# Patient Record
Sex: Female | Born: 1980 | Race: White | Hispanic: No | Marital: Single
Health system: Southern US, Academic
[De-identification: ages and names within clinical notes are randomized; demographics above are authoritative.]

---
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
## 2018-09-12 DIAGNOSIS — I823 Embolism and thrombosis of renal vein: Secondary | ICD-10-CM

## 2020-05-02 ENCOUNTER — Other Ambulatory Visit (HOSPITAL_COMMUNITY): Payer: Self-pay

## 2020-05-02 LAB — EXTERNAL COVID-19 MOLECULAR RESULT: External 2019-n-CoV/SARS-CoV-2: POSITIVE — AB

## 2021-06-24 ENCOUNTER — Emergency Department (HOSPITAL_COMMUNITY): Payer: BC Managed Care – PPO

## 2021-06-24 ENCOUNTER — Encounter (HOSPITAL_COMMUNITY): Payer: Self-pay | Admitting: Physician Assistant

## 2021-06-24 ENCOUNTER — Emergency Department
Admission: EM | Admit: 2021-06-24 | Discharge: 2021-06-24 | Disposition: A | Discharge status: Home / Self Care | Payer: BC Managed Care – PPO | Attending: Physician Assistant | Admitting: Physician Assistant

## 2021-06-24 ENCOUNTER — Other Ambulatory Visit: Payer: Self-pay

## 2021-06-24 DIAGNOSIS — U071 COVID-19: Secondary | ICD-10-CM | POA: Insufficient documentation

## 2021-06-24 DIAGNOSIS — M791 Myalgia, unspecified site: Secondary | ICD-10-CM | POA: Insufficient documentation

## 2021-06-24 DIAGNOSIS — Z6841 Body Mass Index (BMI) 40.0 and over, adult: Secondary | ICD-10-CM

## 2021-06-24 DIAGNOSIS — J45909 Unspecified asthma, uncomplicated: Secondary | ICD-10-CM | POA: Insufficient documentation

## 2021-06-24 DIAGNOSIS — R509 Fever, unspecified: Secondary | ICD-10-CM | POA: Insufficient documentation

## 2021-06-24 DIAGNOSIS — R059 Cough, unspecified: Secondary | ICD-10-CM | POA: Insufficient documentation

## 2021-06-24 LAB — CBC WITH DIFF
BASOPHIL #: 0 10*3/uL (ref 0.00–0.30)
BASOPHIL %: 0 % (ref 0–3)
EOSINOPHIL #: 0.2 10*3/uL (ref 0.00–0.80)
EOSINOPHIL %: 3 % (ref 0–7)
HCT: 42.7 % (ref 37.0–47.0)
HGB: 14.7 g/dL (ref 12.5–16.0)
LYMPHOCYTE #: 1.1 10*3/uL (ref 1.10–5.00)
LYMPHOCYTE %: 19 % — ABNORMAL LOW (ref 25–45)
MCH: 31.4 pg (ref 27.0–32.0)
MCHC: 34.5 g/dL (ref 32.0–36.0)
MCV: 91.1 fL (ref 78.0–99.0)
MONOCYTE #: 0.7 10*3/uL (ref 0.00–1.30)
MONOCYTE %: 12 % (ref 0–12)
MPV: 8.5 fL (ref 7.4–10.4)
NEUTROPHIL #: 3.8 10*3/uL (ref 1.80–8.40)
NEUTROPHIL %: 66 % (ref 40–76)
PLATELETS: 243 10*3/uL (ref 140–440)
RBC: 4.69 10*6/uL (ref 4.20–5.40)
RDW: 13.2 % (ref 11.6–14.8)
WBC: 5.9 10*3/uL (ref 4.0–10.5)
WBCS UNCORRECTED: 5.9 10*3/uL

## 2021-06-24 LAB — COMPREHENSIVE METABOLIC PANEL, NON-FASTING
ALBUMIN/GLOBULIN RATIO: 1.2 (ref 0.8–1.4)
ALBUMIN: 3.8 g/dL (ref 3.5–5.7)
ALKALINE PHOSPHATASE: 63 U/L (ref 34–104)
ALT (SGPT): 13 U/L (ref 7–52)
ANION GAP: 7 mmol/L — ABNORMAL LOW (ref 10–20)
AST (SGOT): 13 U/L (ref 13–39)
BILIRUBIN TOTAL: 0.5 mg/dL (ref 0.3–1.2)
BUN/CREA RATIO: 16 (ref 6–22)
BUN: 13 mg/dL (ref 7–25)
CALCIUM, CORRECTED: 8.9 mg/dL (ref 8.9–10.8)
CALCIUM: 8.7 mg/dL (ref 8.6–10.3)
CHLORIDE: 109 mmol/L — ABNORMAL HIGH (ref 98–107)
CO2 TOTAL: 24 mmol/L (ref 21–31)
CREATININE: 0.82 mg/dL (ref 0.60–1.30)
ESTIMATED GFR: 92 mL/min/{1.73_m2} (ref >59)
GLOBULIN: 3.1 (ref 2.9–5.4)
GLUCOSE: 99 mg/dL (ref 74–109)
OSMOLALITY, CALCULATED: 280 mOsm/kg (ref 270–290)
POTASSIUM: 4.2 mmol/L (ref 3.5–5.1)
PROTEIN TOTAL: 6.9 g/dL (ref 6.4–8.9)
SODIUM: 140 mmol/L (ref 136–145)

## 2021-06-24 LAB — COVID-19, FLU A/B, RSV RAPID BY PCR
INFLUENZA VIRUS TYPE A: NOT DETECTED
INFLUENZA VIRUS TYPE B: NOT DETECTED
RESPIRATORY SYNCTIAL VIRUS (RSV): NOT DETECTED
SARS-CoV-2: DETECTED — AB

## 2021-06-24 NOTE — ED Nurses Note (Signed)
Patient discharged home with family.  AVS reviewed with patient.  A written copy of the AVS and discharge instructions was given to the patient.  Questions sufficiently answered as needed.  Patient encouraged to follow up with PCP as indicated.  In the event of an emergency, patient instructed to call 911 or go to the nearest emergency room.

## 2021-06-24 NOTE — ED Provider Notes (Signed)
[facility]  ED Primary Provider Note  History of Present Illness   Chief Complaint   Patient presents with   . Flu Like Symptoms     Bailey Garcia is a 41 y.o. female who had concerns including Flu Like Symptoms.  Arrival: The patient arrived by Car    Pt presents with myalgia, dry cough, fever Tmax 102.7 yesterday.Increased SOB from baseline. She has a history of asthma controlled with albuterol.  She has been using her inhaler every 4-6 hours which does offer relief.  Patient taking Tylenol and Motrin as needed for fever which does off slowly.  She denies earache, sore throat, abdominal pain, nausea, vomiting.        Review of Systems   Pertinent positive and negative ROS as per HPI.  Historical Data   History Reviewed This Encounter: Medical History  Surgical History  Family History  Social History      Physical Exam   ED Triage Vitals [06/24/21 1134]   BP (Non-Invasive) 126/86   Heart Rate 89   Respiratory Rate 18   Temperature 37 C (98.6 F)   SpO2 100 %   Weight 110 kg (242 lb)   Height 1.626 m ('5\' 4"' )     Physical Exam   General: WDWN, appears stated age, alert in NAD. Cooperative throughout the exam.  Eyes:  no orbital edema or erythema, pupils equal, EOMI, no scleral icterus or conjunctival injection,  Ears: External ear and EAC without erythema, edema or deformity. TM is clear with appropriate landmarks and without erythema or effusion.  Nose: Nares patent without discharge. Turbinants appear normal size and color.   Throat: Mucosa moist, soft and hard palate without erythema or lesions. Uvula midline. Tonsils and posterior pharyngeal wall are without edema, erythema or exudate.    Neck: Supple and without LAD  CV: RRR, crisp S1, S2, no extra heart sounds,   Resp: LCTAB, no respiratory distress  MSK: moves all extremities  Skin: warm and intact, of normal color, no lesions noted  Neuro: normal tone, no involuntary movements, GCS 4,5,6.   Psych: speech and affect are  appropriate, thought processes intact   Patient Data     Labs Ordered/Reviewed   COMPREHENSIVE METABOLIC PANEL, NON-FASTING - Abnormal; Notable for the following components:       Result Value    CHLORIDE 109 (*)     ANION GAP 7 (*)     All other components within normal limits    Narrative:     Estimated Glomerular Filtration Rate (eGFR) is calculated using the CKD-EPI (2021) equation, intended for patients 18 years of age and older. If gender is not documented or "unknown", there will be no eGFR calculation.   COVID-19, FLU A/B, RSV RAPID BY PCR - Abnormal; Notable for the following components:    SARS-CoV-2 Detected (*)     All other components within normal limits    Narrative:     Results are for the simultaneous qualitative identification of SARS-CoV-2 (formerly 2019-nCoV), Influenza A, Influenza B, and RSV RNA. These etiologic agents are generally detectable in nasopharyngeal and nasal swabs during the ACUTE PHASE of infection. Hence, this test is intended to be performed on respiratory specimens collected from individuals with signs and symptoms of upper respiratory tract infection who meet Centers for Disease Control and Prevention (CDC) clinical and/or epidemiological criteria for Coronavirus Disease 2019 (COVID-19) testing. CDC COVID-19 criteria for testing on human specimens is available at [facility] webpage information  for Healthcare Professionals: Coronavirus Disease 2019 (COVID-19) ([email or web address]).     False-negative results may occur if the virus has genomic mutations, insertions, deletions, or rearrangements or if performed very early in the course of illness. Otherwise, negative results indicate virus specific RNA targets are not detected, however negative results do not preclude SARS-CoV-2 infection/COVID-19, Influenza, or Respiratory syncytial virus infection. Results should not be used as the sole basis for patient management decisions. Negative results  must be combined with clinical observations, patient history, and epidemiological information. If upper respiratory tract infection is still suspected based on exposure history together with other clinical findings, re-testing should be considered.    Disclaimer:   This assay has been authorized by FDA under an Emergency Use Authorization for use in laboratories certified under the Clinical Laboratory Improvement Amendments of 1988 (CLIA), 42 U.S.C. [phone], to perform high complexity tests. The impacts of vaccines, antiviral therapeutics, antibiotics, chemotherapeutic or immunosuppressant drugs have not been evaluated.     Test methodology:   Cepheid Xpert Xpress SARS-CoV-2/Flu/RSV Assay real-time polymerase chain reaction (RT-PCR) test on the GeneXpert Dx and Xpert Xpress systems.   CBC WITH DIFF - Abnormal; Notable for the following components:    LYMPHOCYTE % 19 (*)     All other components within normal limits   CBC/DIFF    Narrative:     The following orders were created for panel order CBC/DIFF.  Procedure                               Abnormality         Status                     ---------                               -----------         ------                     CBC WITH GHWE[[id number]]                Abnormal            Final result                 Please view results for these tests on the individual orders.     XR CHEST PA AND LATERAL   Final Result by Edi, Radresults In (04/14 1159)   NEGATIVE CHEST         Radiologist location ID: [place] Decision Making        Medical Decision Making  History and physical exam Lattie Haw differential diagnosis of pelvic, RSV, flu, other viral upper respiratory infection, pneumonia.  Reviewed patient's CBC and chemistry are both unremarkable.  Chest x-ray shows no pneumonia.  For plaque shows positive COVID.    Amount and/or Complexity of Data Reviewed  Labs: ordered.  Radiology: ordered. Decision-making details documented in ED Course.  ECG/medicine  tests: independent interpretation performed.        ED Course as of 06/24/21 1459   Fri Jun 24, 2021   1355 XR CHEST PA AND LATERAL  IMPRESSION:  NEGATIVE CHEST   1355 COMPREHENSIVE METABOLIC PANEL, NON-FASTING(!)  Unremarkable     1356 CBC/DIFF(!)  Unremarkable   1453 SARS CORONAVIRUS  2 (SARS-CoV-2)(!): Detected            Clinical Impression   COVID (Primary)       Disposition: Discharged       Sammuel Bailiff, PA-C

## 2021-06-24 NOTE — ED Triage Notes (Signed)
Flu like symptoms x3days.

## 2021-06-24 NOTE — ED Nurses Note (Signed)
Patient seen, treated, and discharged by ER provider prior to being assigned to a primary nurse. No primary nursing assessment done.

## 2021-06-24 NOTE — ED APP Handoff Note (Signed)
[facility]  Emergency Department  Provider in Triage Note    Name: Bailey Garcia  Age: 41 y.o.  Gender: female     Subjective:   Bailey Garcia is a 41 y.o. female who presents with complaint of Flu Like Symptoms  .  flu like symptoms x 3 days. Reports myalgia, cough, fever Tmax 102.7 yesterday. Last antipyretic intake yesterday.     Objective:   There were no vitals filed for this visit.   Focused Physical Exam shows female sitting upright in NAD.     Assessment:  A medical screening exam was completed.  This patient is a 41 y.o. female with initial findings showing multiple complaints.     Plan:  Please see initial orders and work-up below.  This is to be continued with full evaluation in the main Emergency Department.     No current facility-administered medications for this encounter.     No results found for this or any previous visit (from the past 24 hour(s)).     Senaida Lange, PA-C  06/24/2021, 11:34

## 2021-06-24 NOTE — Discharge Instructions (Signed)
Increase fluids. You may use over the counter Tylenol or Motrin as directed on packaging for fever and aches. Talk to your pharmacist if you have any questions. Return to the ER if your symptoms worsen.

## 2021-10-12 ENCOUNTER — Other Ambulatory Visit (HOSPITAL_COMMUNITY): Payer: Self-pay | Admitting: PHYSICIAN ASSISTANT

## 2021-10-12 ENCOUNTER — Other Ambulatory Visit: Payer: Self-pay

## 2021-10-12 ENCOUNTER — Inpatient Hospital Stay
Admission: RE | Admit: 2021-10-12 | Discharge: 2021-10-12 | Disposition: A | Discharge status: Home / Self Care | Payer: BC Managed Care – PPO | Source: Ambulatory Visit | Attending: PHYSICIAN ASSISTANT | Admitting: PHYSICIAN ASSISTANT

## 2021-10-12 DIAGNOSIS — S4991XA Unspecified injury of right shoulder and upper arm, initial encounter: Secondary | ICD-10-CM | POA: Insufficient documentation

## 2021-11-07 ENCOUNTER — Other Ambulatory Visit: Payer: Self-pay

## 2021-11-07 ENCOUNTER — Ambulatory Visit (HOSPITAL_COMMUNITY)
Admission: RE | Admit: 2021-11-07 | Discharge: 2021-11-07 | Disposition: A | Discharge status: Still In Hospital | Payer: BC Managed Care – PPO | Source: Ambulatory Visit | Attending: PHYSICIAN ASSISTANT | Admitting: PHYSICIAN ASSISTANT

## 2021-11-07 DIAGNOSIS — S4991XD Unspecified injury of right shoulder and upper arm, subsequent encounter: Secondary | ICD-10-CM | POA: Insufficient documentation

## 2021-11-07 DIAGNOSIS — M25511 Pain in right shoulder: Secondary | ICD-10-CM | POA: Insufficient documentation
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
## 2021-11-10 ENCOUNTER — Ambulatory Visit (HOSPITAL_COMMUNITY)
Admission: RE | Admit: 2021-11-10 | Discharge: 2021-11-10 | Disposition: A | Discharge status: Still In Hospital | Payer: BC Managed Care – PPO | Source: Ambulatory Visit | Attending: PHYSICIAN ASSISTANT | Admitting: PHYSICIAN ASSISTANT

## 2021-11-10 ENCOUNTER — Other Ambulatory Visit: Payer: Self-pay

## 2021-11-10 NOTE — PT Treatment (Signed)
[facility]  Outpatient Physical Therapy  [address]  [place], [address]  [phone]  (Fax) [phone]    Physical Therapy Treatment Note    Date: 11/10/2021  Patient's Name: Bailey Garcia  Date of Birth: 03-13-1981            Visit #/POC: 2 of 8-12  Authorization:  POC Signed?: No  POC Ends: 12/19/21  Next Progress Note Due: 10th visit      Evaluating Physical Therapist: Wyona Almas, PT  PT diagnosis/Reason for Referral: Injury of R shoulder  Next Scheduled Physician Appointment: TBD   Allergies/Contraindications: Penicillins          Subjective: Couldn't really tell if the taping helped but it did start itching her skin.    Objective: Began with [place] to R shoulder followed by there ex per flow sheet for R shoulder ROM.    EXERCISE/ACTIVITY NAME REPETITIONS RESISTANCE COMPLETED THIS DOS   [place]   8 minutes 1.0 Yes   Gentle R shoulder PROM   - - Yes                                                                 Assessment: Pt noted a Devita discomfort after shoulder PROM. Has slight irritation of skin so no taping was done today.   Short term goals (3 weeks):  -Subjective c/o intermittent verus Constant pain. Worst SPS rating less than 5.  -Resting pain level and proximal stability WFL to permit normal posturing of affected UE at rest.   - PROM WFL all planes   - compliant with HEP     Long term goals (4-6 weeks):  -Proximal stability of right  shoulder WFL to permit normal arm swing during ambulation.  -Sleep not disrupted by shoulder pain.  -AROM and strength WFL for use of involved UE with personal/household/work ADLs without compensatory mechanics due to weakness or pain.   -Max SPS rating = to or 2 or less.  - Patient specific functional score > 5    Plan: Continue and progress as tolerated per PT POC    Total Session Time 33 and Timed code minutes 33  THERAPEUTIC EXERCISE 25 minutes and  ULTRASOUND      Meryle Pugmire, PTA  11/10/2021, 14:05

## 2021-11-15 ENCOUNTER — Other Ambulatory Visit: Payer: Self-pay

## 2021-11-15 ENCOUNTER — Ambulatory Visit (HOSPITAL_COMMUNITY)
Admission: RE | Admit: 2021-11-15 | Discharge: 2021-11-15 | Disposition: A | Discharge status: Still In Hospital | Payer: BC Managed Care – PPO | Source: Ambulatory Visit | Attending: PHYSICIAN ASSISTANT | Admitting: PHYSICIAN ASSISTANT

## 2021-11-15 NOTE — PT Treatment (Signed)
[facility]  Outpatient Physical Therapy  [address]  [place], [address]  [phone]  (Fax) [phone]    Physical Therapy Treatment Note    Date: 11/15/2021  Patient's Name: Bailey Garcia  Date of Birth: 05-Feb-1981        Visit #/POC: 3 of 8-12  Authorization:  POC Signed?: No  POC Ends: 12/19/21  Next Progress Note Due: 10th visit        Evaluating Physical Therapist: Wyona Almas, PT  PT diagnosis/Reason for Referral: Injury of R shoulder  Next Scheduled Physician Appointment: TBD        Allergies/Contraindications: Penicillins       Subjective: pain level is a 5 today. Feels maybe that the tape did help some after she went without it     Objective: [place], PROM, scapular mobs and therex per flow sheet   EXERCISE/ACTIVITY NAME REPETITIONS RESISTANCE COMPLETED THIS DOS   [place]    8 minutes 1.2 Yes   Gentle R shoulder PROM    - - Yes    supine serratus punches   10x   a/a  yes    supine scapular pinches      10X    yes     rows      10 x   yellow band  yes  HEP 9/5     shoulder ext      10x   yellow band  yes HEP 9/5    kinesiotape right sh  A/c jt and correction     3 I strips     yes                                Access Code: [email or web address]  URL: [email or web address]  Date: 11/15/2021  Prepared by: Wyona Almas    Exercises  - Seated Shoulder Row with Anchored Resistance  - 1 x daily - 7 x weekly - 1-2 sets - 10 reps  - Shoulder extension with resistance - Neutral  - 1 x daily - 7 x weekly - 1-2 sets - 10 reps      Assessment: PROM WFL with end range pain flex and abd. Tolerated initiation of scapular strengthening exercises. Retaped but stressed to patient to remove if irritation occurs   Short term goals (3 weeks):  -Subjective c/o intermittent verus Constant pain. Worst SPS rating less than 5.  -Resting pain level and proximal stability WFL to permit normal posturing of affected UE at rest.   - PROM WFL all planes   - compliant with HEP     Long term goals (4-6  weeks):  -Proximal stability of right  shoulder WFL to permit normal arm swing during ambulation.  -Sleep not disrupted by shoulder pain.  -AROM and strength WFL for use of involved UE with personal/household/work ADLs without compensatory mechanics due to weakness or pain.   -Max SPS rating = to or 2 or less.  - Patient specific functional score > 5    Plan: continue and progress scapular stabilizer strengthening     Total Session Time 42 and Timed code minutes 42  THERAPEUTIC EXERCISE 37 minutes and  ULTRASOUND      Wyona Almas, PT  11/15/2021, 11:36

## 2021-11-17 ENCOUNTER — Ambulatory Visit (HOSPITAL_COMMUNITY): Payer: Self-pay

## 2021-11-22 ENCOUNTER — Ambulatory Visit (HOSPITAL_COMMUNITY)
Admission: RE | Admit: 2021-11-22 | Discharge: 2021-11-22 | Disposition: A | Discharge status: Still In Hospital | Payer: BC Managed Care – PPO | Source: Ambulatory Visit | Attending: PHYSICIAN ASSISTANT | Admitting: PHYSICIAN ASSISTANT

## 2021-11-22 ENCOUNTER — Other Ambulatory Visit: Payer: Self-pay

## 2021-11-22 NOTE — PT Treatment (Signed)
[facility]  Outpatient Physical Therapy  [address]  [place] , [address]  [phone]  (Fax) [phone]    Physical Therapy Treatment Note    Date: 11/22/2021  Patient's Name: Bailey Garcia  Date of Birth: 1980/04/14        Visit #/POC: 3 of 8-12  Authorization:  POC Signed?: No  POC Ends: 12/19/21  Next Progress Note Due: 10th visit        Evaluating Physical Therapist: Wyona Almas, PT  PT diagnosis/Reason for Referral: Injury of R shoulder  Next Scheduled Physician Appointment: TBD        Allergies/Contraindications: Penicillins    Subjective: states shoulder still hurts. Tape helped - tape was removed 3-4 days ago. Pain level today 1.  Pain range past range 1-7.  Pain at sleep and at work. Lifting heavy things at work hurt. Missed last appt d/t work     Objective: [place], therex, kinesiotaping per flow sheet  PROM right shoulder WFL discomfort end range flex and abd   Progressed scapular stabilizer exercises to red band  EXERCISE/ACTIVITY NAME REPETITIONS RESISTANCE COMPLETED THIS DOS   [place]    8 minutes 1.2 Yes   Gentle R shoulder PROM    - - Yes    supine serratus punches   10x  GMB   yes    supine scapular pinches      10X   yes     rows      10 x   red  band  yes  HEP 9/5     shoulder ext      10x   red  band  yes HEP 9/5    kinesiotape right sh  A/c jt and correction     3 I strips     yes     supine circles CW/CCW     x10 each  GMB  yes                  Assessment: PROM WFL.  Pain level 1-7 now  -worse at work. Weakness noted scapular stabilizers. Tolerating the tape. Sits with slouched posture d/t weakness    Short term goals (3 weeks):  -Subjective c/o intermittent verus Constant pain. Worst SPS rating less than 5.    1-7 on 11/22/21   -Resting pain level and proximal stability WFL to permit normal posturing of affected UE at rest.   - PROM WFL all planes   - compliant with HEP     Long term goals (4-6 weeks):  -Proximal stability of right  shoulder WFL to  permit normal arm swing during ambulation.  -Sleep not disrupted by shoulder pain.  -AROM and strength WFL for use of involved UE with personal/household/work ADLs without compensatory mechanics due to weakness or pain.   -Max SPS rating = to or 2 or less.  - Patient specific functional score > 5    Plan: progress strengthening     Total Session Time 40 and Timed code minutes 40  THERAPEUTIC EXERCISE 32 minutes and  ULTRASOUND      Wyona Almas, PT  11/22/2021, 10:05

## 2021-11-24 ENCOUNTER — Ambulatory Visit
Admission: RE | Admit: 2021-11-24 | Discharge: 2021-11-24 | Disposition: A | Discharge status: Still In Hospital | Payer: BC Managed Care – PPO | Source: Ambulatory Visit | Attending: PHYSICIAN ASSISTANT | Admitting: PHYSICIAN ASSISTANT

## 2021-11-24 ENCOUNTER — Other Ambulatory Visit: Payer: Self-pay

## 2021-11-24 NOTE — PT Treatment (Signed)
[facility]  Outpatient Physical Therapy  [place], [address]  [phone]  [phone]    Physical Therapy Treatment Note    Date: 11/24/2021  Patient's Name: Bailey Garcia  Date of Birth: 18-Nov-1980    Patient was 9 mins late 11    Visit #/POC: 5 of 8-12  Authorization:  POC Signed?: No  POC Ends: 12/19/21  Next Progress Note Due: 10th visit        Evaluating Physical Therapist: Zenia Resides, PT  PT diagnosis/Reason for Referral: Injury of R shoulder  Next Scheduled Physician Appointment: TBD        Allergies/Contraindications: Penicillins    Subjective: pain level 6 going from right shoulder to elbow. Tape still on. Thinks she slept on it wrong. Pain range past week 2-8, worse during work     Objective: AROM right shoulder flex 140 (was 130 at [facility]), Ext 65 (was 60 at [facility]), Abd 110 then pain and can go to 125 (was 75 at [facility]), IR to T10 (was T12 at [facility] ER to T2 (same)     Therex per flow sheet right shoulder for strengthening.  Education on lifting initiated   EXERCISE/ACTIVITY NAME REPETITIONS RESISTANCE COMPLETED THIS DOS   [place]    8 minutes 1.2 No    Gentle R shoulder PROM    - - no    supine serratus punches   10x  GMB   yes    supine scapular pinches      10X    yes     rows      10 x   red  band  yes  HEP 9/5     shoulder ext      10x   red  band  yes HEP 9/5    kinesiotape right sh  A/c jt and correction     3 I strips     no in place still    supine circles CW/CCW     x10 each  GMB  yes    supine multi angle   IR  ER          10 each   10 each      Red band  Yellow band      Yes  yes   Supine circles at 90 degrees flex  CW/CCW      2x 10      GMG      yes   Sh stabilization supine at 90 10 each 4 planes Yellow  Yes    Sidelying ER                   Abd                  flex 10X  10X  10x 1#  0#  0# Yes   Yes  yes     UBE retro  5 mins 60 RPM  Yes        Assessment: AROM has improved all planes. Strength progressing. Pain with abd mostly.  Tolerated strengthening program well.   Short term goals (3 weeks):  -Subjective c/o intermittent verus Constant pain. Worst SPS rating less than 5.    2-8 on 11/24/21   -Resting pain level and proximal stability WFL to permit normal posturing of affected UE at rest.   MET 11/24/21   - PROM WFL all planes Progressing 11/24/21   - compliant with HEP  Long term goals (4-6 weeks):  -Proximal stability of right  shoulder WFL to permit normal arm swing during ambulation.  -Sleep not disrupted by shoulder pain.  -AROM and strength WFL for use of involved UE with personal/household/work ADLs without compensatory mechanics due to weakness or pain.   -Max SPS rating = to or 2 or less.  - Patient specific functional score > 5    Plan: continue and progress strengthening    Total Session Time 30 and Timed code minutes 30   THERAPEUTIC EXERCISE 30 minutes      Zenia Resides, PT  11/24/2021, 14:41

## 2021-11-29 ENCOUNTER — Ambulatory Visit (HOSPITAL_COMMUNITY): Payer: Self-pay

## 2021-12-01 ENCOUNTER — Ambulatory Visit (HOSPITAL_COMMUNITY): Payer: Self-pay

## 2021-12-06 ENCOUNTER — Ambulatory Visit (HOSPITAL_COMMUNITY): Payer: Self-pay

## 2021-12-09 ENCOUNTER — Ambulatory Visit
Admission: RE | Admit: 2021-12-09 | Discharge: 2021-12-09 | Disposition: A | Discharge status: Still In Hospital | Payer: BC Managed Care – PPO | Source: Ambulatory Visit | Attending: PHYSICIAN ASSISTANT | Admitting: PHYSICIAN ASSISTANT

## 2021-12-09 ENCOUNTER — Other Ambulatory Visit: Payer: Self-pay

## 2021-12-09 DIAGNOSIS — S4991XD Unspecified injury of right shoulder and upper arm, subsequent encounter: Secondary | ICD-10-CM | POA: Insufficient documentation

## 2021-12-09 DIAGNOSIS — M25511 Pain in right shoulder: Secondary | ICD-10-CM | POA: Insufficient documentation

## 2021-12-09 NOTE — PT Treatment (Addendum)
[facility]  Outpatient Physical Therapy  [place], [address]  [phone]  [phone]    Physical Therapy Treatment Note    Date: 12/09/2021  Patient's Name: Bailey Garcia  Date of Birth: November 29, 1980    PHYSICAL THERAPY DISCHARGE NOTE  Patient attended 6 sessions of PT From 8/28 to 10/5 for right shoulder injury. Patient had 4 no shows and 2 cancellations. Her last 2 scheduled appts were no shows. Due to our attendance policy she being discharged from PT services for the no shows.   Last measurements taken of right shoulder on 9/14: AROM right shoulder flex 140 (was 130 at [facility]), Ext 65 (was 60 at [facility]), Abd 110 then pain and can go to 125 (was 75 at [facility]), IR to T10 (was T12 at [facility]), ER to T2 (same)   Patient will be discharged from PT services at this time - goals were not met.      Visit #/POC: 6 of 8-12  Authorization:  POC Signed?: No  POC Ends: 12/19/21  Next Progress Note Due: 10th visit        Evaluating Physical Therapist: Zenia Resides, PT  PT diagnosis/Reason for Referral: Injury of R shoulder  Next Scheduled Physician Appointment: TBD        Allergies/Contraindications: Penicillins    Subjective: 10 minute late arrival. Patient returns to clinic after two week break in care secondary to one no show and two cancels due to illness. She rates pain 4/10 today. She reports no problems with HEP and does it twice daily.     Objective:     Therex per flow sheet right shoulder for strengthening:    EXERCISE/ACTIVITY NAME REPETITIONS RESISTANCE COMPLETED THIS DOS   [place]    8 minutes 1.2 No    Gentle R shoulder PROM    - - no    supine serratus punches  30 rep GMB   yes   Supine circles 30 rep CW/CCW GMB Yes     supine scapular pinches     30 rep    yes     rows      10 x   red  band  no HEP 9/5     shoulder ext      10x   red  band  no HEP 9/5    kinesiotape right sh  A/c jt and correction     3 I strips    Yes     supine circles CW/CCW     x10 each  GMB   yes    supine multi angle   IR  ER          10 each   10 each      Red band  Yellow band      No  No     Supine circles at 90 degrees flex  CW/CCW      2x 10      GMG      yes   Sh stabilization supine at 90 10 each 4 planes Yellow  No    Sidelying ER                   Abd                  flex 15 rep  15 rep  15 rep 2#  2#  2# Yes   Yes  yes     UBE alternate  forward/retro every minute 6 mins 60 RPM  Yes    Seated row machine 15 reps 30# Yes    Rear delt machine 15 reps 10# Yes                    Assessment: Progressed strengthening exercise today to include seated row machine, rear deltoid machine, and increased weight with sidelying exercises. Patient tolerated well with no increase in symptoms. She reported pain was resolved at end of session. Gave patient green resistance band for home.     Short term goals (3 weeks):  -Subjective c/o intermittent verus Constant pain. Worst SPS rating less than 5.    2-8 on 11/24/21   -Resting pain level and proximal stability WFL to permit normal posturing of affected UE at rest.   MET 11/24/21   - PROM WFL all planes Progressing 11/24/21   - compliant with HEP      Long term goals (4-6 weeks):  -Proximal stability of right  shoulder WFL to permit normal arm swing during ambulation.  -Sleep not disrupted by shoulder pain.  -AROM and strength WFL for use of involved UE with personal/household/work ADLs without compensatory mechanics due to weakness or pain.   -Max SPS rating = to or 2 or less.  - Patient specific functional score > 5      Plan: continue per POC.    Total Session Time 35 and Timed code minutes 35  THERAPEUTIC EXERCISE 35 minutes      [employer], PTA  12/09/2021 09:42

## 2021-12-14 ENCOUNTER — Ambulatory Visit (HOSPITAL_COMMUNITY): Payer: Self-pay

## 2021-12-15 ENCOUNTER — Ambulatory Visit (HOSPITAL_COMMUNITY): Payer: Self-pay

## 2021-12-21 ENCOUNTER — Ambulatory Visit (HOSPITAL_COMMUNITY): Payer: Self-pay

## 2021-12-23 ENCOUNTER — Ambulatory Visit (HOSPITAL_COMMUNITY): Payer: Self-pay

## 2022-08-16 IMAGING — MR MRI SHOULDER RT W/O CONTRAST
6 series · 39 of 40 positions shown · non-contrast
Comparison: None available.

﻿EXAM:  08113   MRI SHOULDER RT W/O CONTRAST
INDICATION: Right shoulder pain, limited range of motion.
TECHNIQUE: Noncontrast multiplanar, multisequence MRI was performed.

[Series 6: T1 · oblique · right · 3.5mm · 0.38mm/px · 7 of 18 slices shown]
[im 1/18]
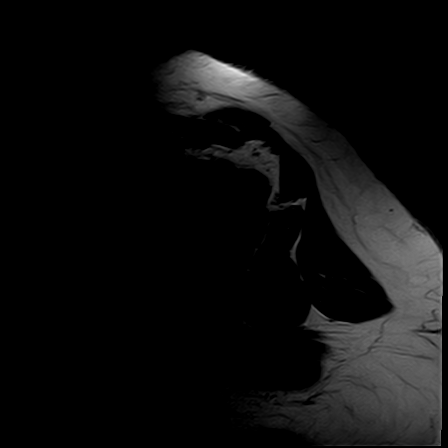
[im 3/18]
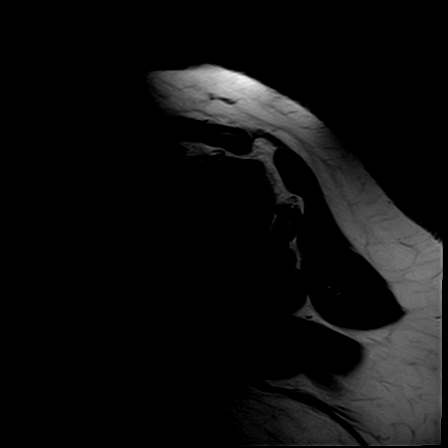
[im 6/18]
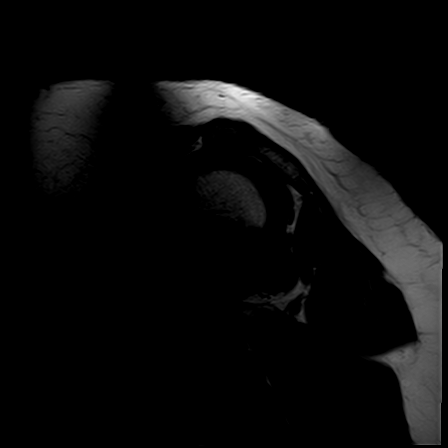
[im 9/18]
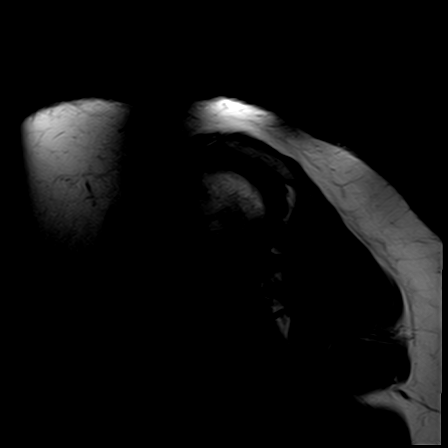
[im 12/18]
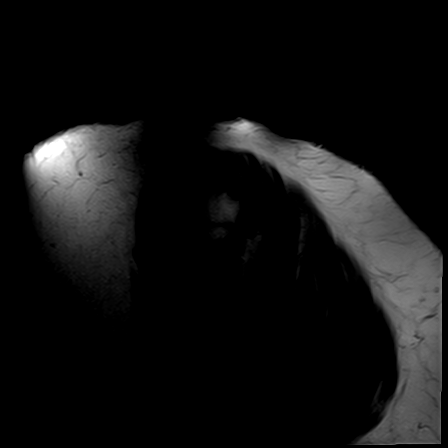
[im 15/18]
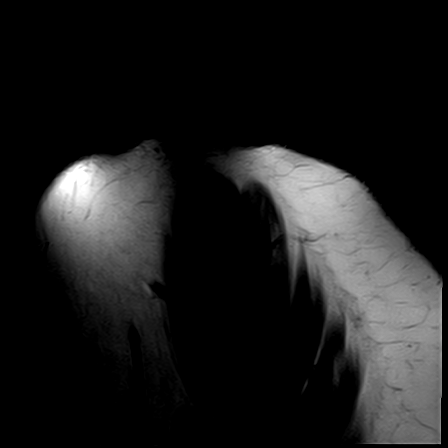
[im 18/18]
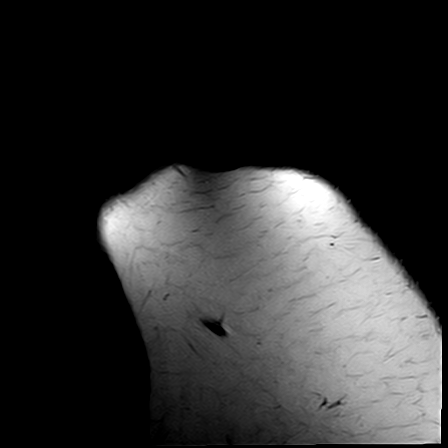

[Series 7: STIR · oblique · right · 3.5mm · 0.53mm/px · 7 of 18 slices shown (1 of 2)]
[im 1/18]
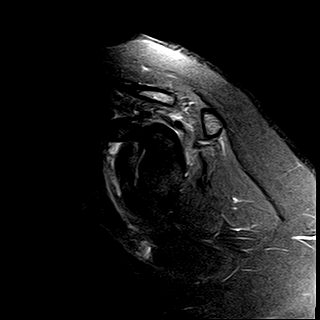
[im 3/18]
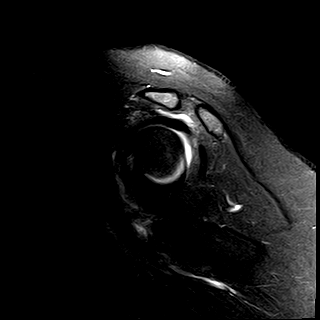
[im 6/18]
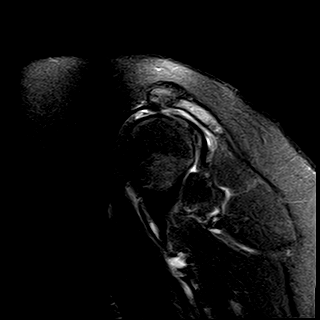
[im 9/18]
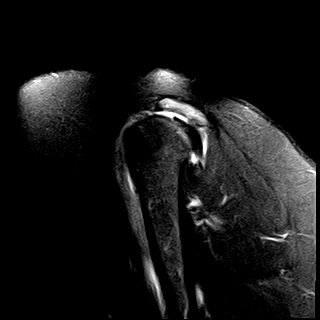
[im 12/18]
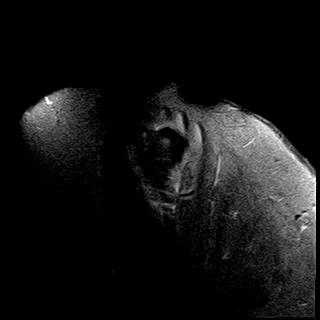
[im 15/18]
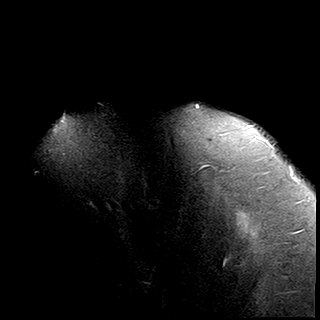
[im 18/18]
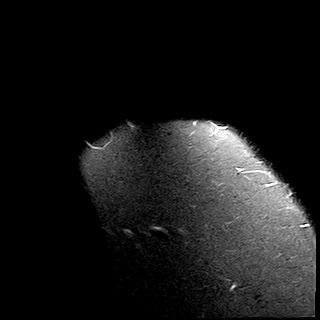

[Series 10: PD fat-sat · oblique · right · 3.5mm · 0.56mm/px · 6 of 18 slices shown]
[im 1/18]
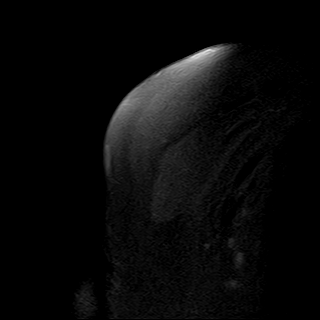
[im 4/18]
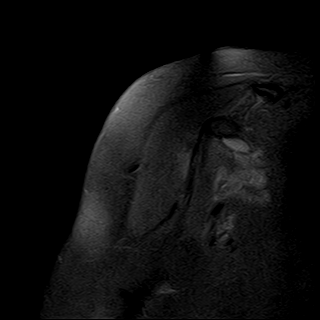
[im 7/18]
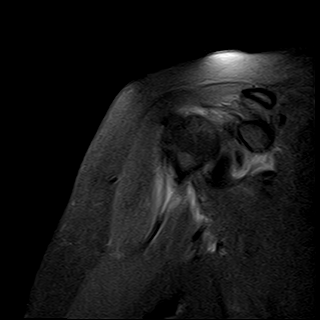
[im 11/18]
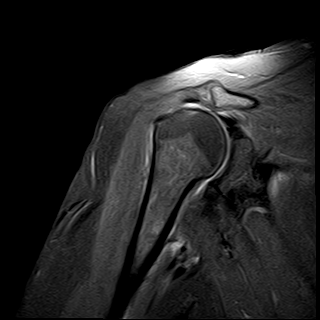
[im 14/18]
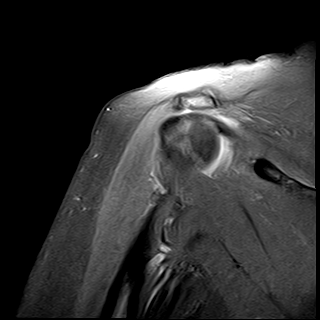
[im 18/18]
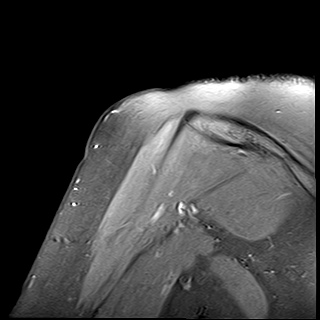

[Series 11: STIR · oblique · right · 3.5mm · 0.56mm/px · 6 of 18 slices shown (2 of 2)]
[im 1/18]
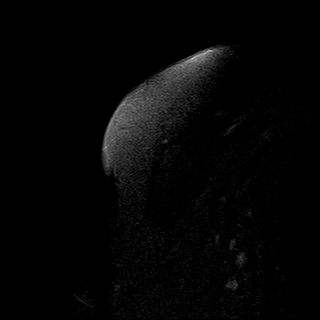
[im 4/18]
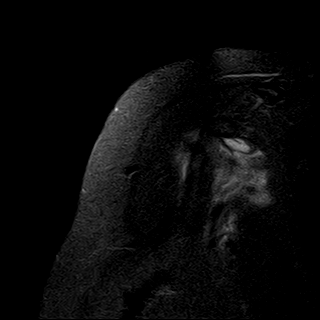
[im 7/18]
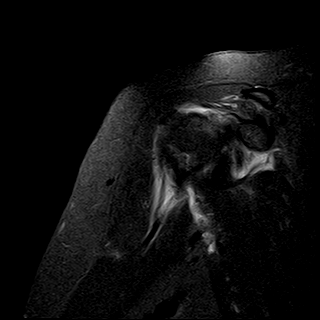
[im 11/18]
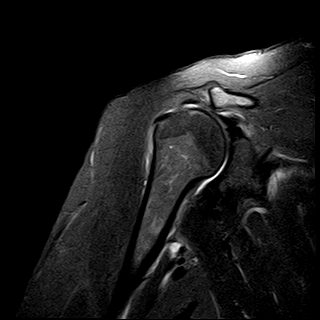
[im 14/18]
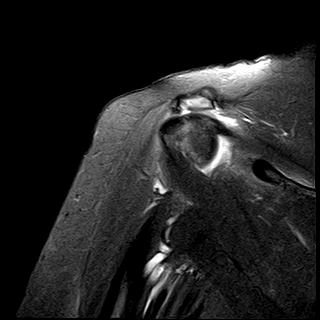
[im 18/18]
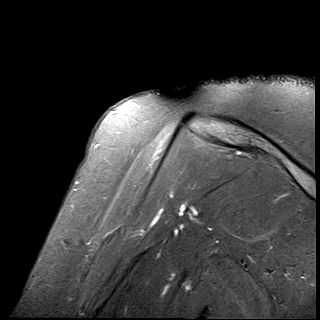

[Series 12: PD · axial · right · 4.0mm · 0.56mm/px · z∈[+39,+111]mm · 6 of 18 slices shown]
[im 1/18]
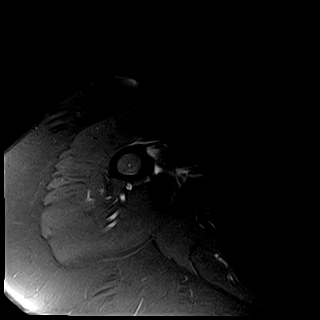
[im 4/18]
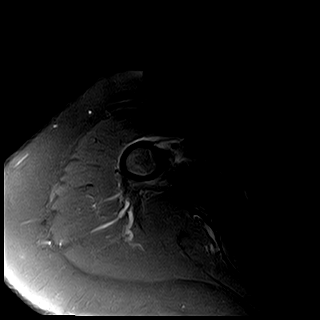
[im 7/18]
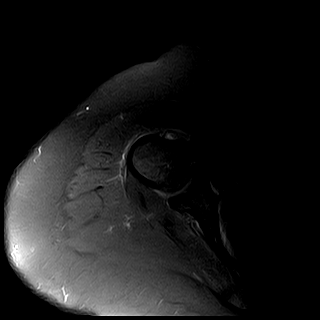
[im 11/18]
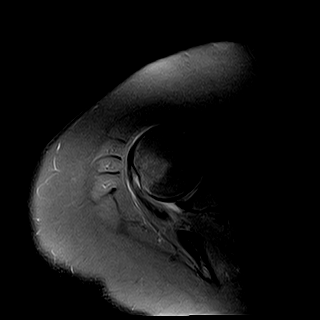
[im 14/18]
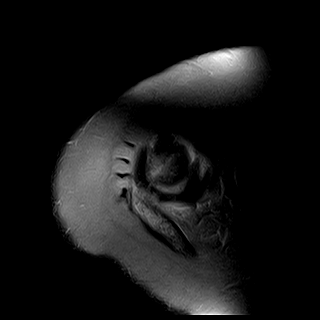
[im 18/18]
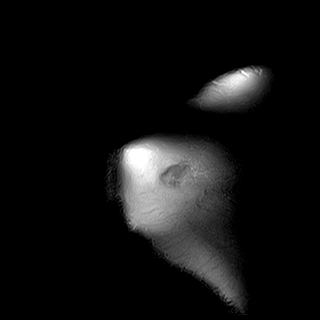

[Series 13: T2 · axial · right · 4.0mm · 0.47mm/px · z∈[+26,+107]mm · 7 of 24 slices shown]
[im 1/24]
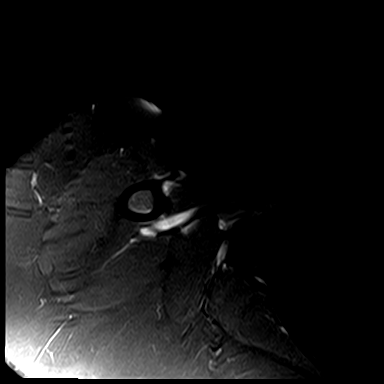
[im 4/24]
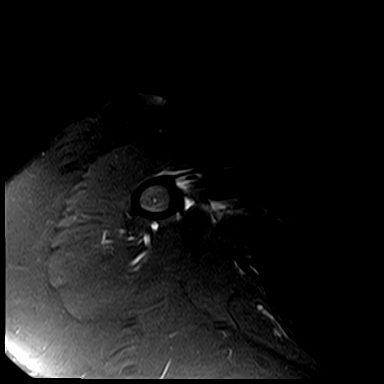
[im 7/24]
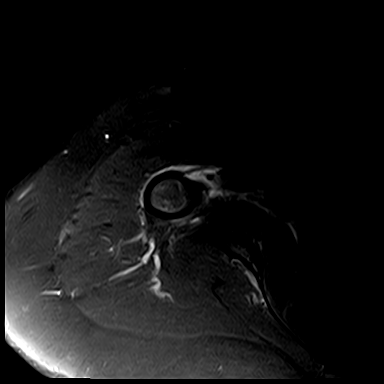
[im 10/24]
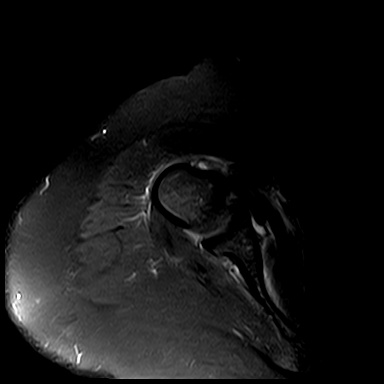
[im 14/24]
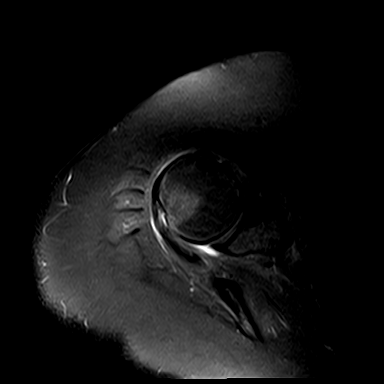
[im 17/24]
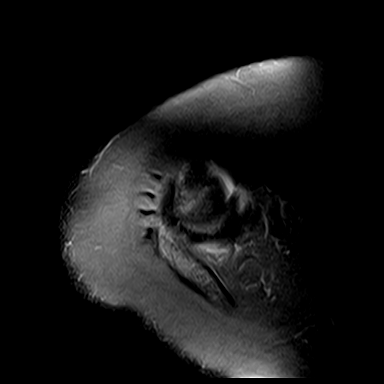
[im 20/24]
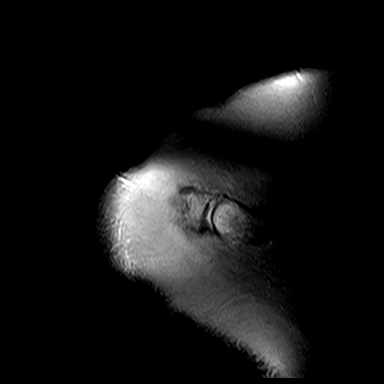

[39 of 40 positions shown; findings below may reference images not displayed]

FINDINGS: The following findings are of indeterminate age. 

There are complete tears of the supraspinatus and infraspinatus tendons.  There are partial tears of the subscapularis and teres minor tendons.  This constitutes a massive rotator cuff tear.  There is associated marked reduction in the subacromial space.  

There are mild degenerative changes involving the acromioclavicular joint.  There is minimal joint fluid.  There is no fracture, dislocation, bone contusion, or significant marrow signal alteration.  The glenoid labrum appears to be intact.
IMPRESSION: Massive rotator cuff tear.

## 2024-04-16 ENCOUNTER — Other Ambulatory Visit (HOSPITAL_COMMUNITY): Payer: Self-pay

## 2024-04-16 DIAGNOSIS — Z1239 Encounter for other screening for malignant neoplasm of breast: Secondary | ICD-10-CM
